# Patient Record
Sex: Male | Born: 2000 | Race: White | Hispanic: No | Marital: Single | Smoking: Current every day smoker
Health system: Southern US, Community
[De-identification: ages and names within clinical notes are randomized; demographics above are authoritative.]

## PROBLEM LIST (undated history)

## (undated) DIAGNOSIS — K589 Irritable bowel syndrome without diarrhea: Secondary | ICD-10-CM

## (undated) DIAGNOSIS — F909 Attention-deficit hyperactivity disorder, unspecified type: Secondary | ICD-10-CM

---
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
## 2022-05-15 ENCOUNTER — Encounter: Payer: Self-pay | Admitting: *Deleted
